# Patient Record
Sex: Male | Born: 1995 | Race: White | Hispanic: No | Marital: Single | Smoking: Current every day smoker
Health system: Southern US, Community
[De-identification: ages and names within clinical notes are randomized; demographics above are authoritative.]

## PROBLEM LIST (undated history)

## (undated) DIAGNOSIS — F419 Anxiety disorder, unspecified: Secondary | ICD-10-CM

---
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
## 2015-05-09 ENCOUNTER — Ambulatory Visit: Admission: EM | Admit: 2015-05-09 | Discharge: 2015-05-09 | Discharge status: Absent without leave | Payer: Self-pay

## 2016-05-25 ENCOUNTER — Emergency Department
Admission: EM | Admit: 2016-05-25 | Discharge: 2016-05-26 | Disposition: A | Discharge status: Home / Self Care | Payer: Self-pay | Attending: Emergency Medicine | Admitting: Emergency Medicine

## 2016-05-25 DIAGNOSIS — F1123 Opioid dependence with withdrawal: Secondary | ICD-10-CM

## 2016-05-25 DIAGNOSIS — F17200 Nicotine dependence, unspecified, uncomplicated: Secondary | ICD-10-CM | POA: Insufficient documentation

## 2016-05-25 DIAGNOSIS — T40601A Poisoning by unspecified narcotics, accidental (unintentional), initial encounter: Secondary | ICD-10-CM

## 2016-05-25 DIAGNOSIS — F1193 Opioid use, unspecified with withdrawal: Secondary | ICD-10-CM

## 2016-05-25 DIAGNOSIS — T402X1A Poisoning by other opioids, accidental (unintentional), initial encounter: Secondary | ICD-10-CM | POA: Insufficient documentation

## 2016-05-25 HISTORY — DX: Anxiety disorder, unspecified: F41.9

## 2016-05-25 NOTE — ED Provider Notes (Signed)
[facility] Emergency Department Provider Note  ____________________________________________   None    (approximate)  I have reviewed the triage vital signs and the nursing notes.   HISTORY  Chief Complaint Drug Overdose    HPI Daniel Costa is a 21 y.o. male who comes to the emergency department via EMS for presumptive drug overdose. According to report the patient was unconscious with pinpoint pupils and slow respirations. He also had some seizure-like activity so she was given a total of 1.5 mg of intranasal naloxone. He then began to rales and was held down, an IV was started, and he was given 2 mg of intravenous naloxone. Shortly thereafter he really woke up completely and began to vomit. When I asked the patient what happened he is currently taking Suboxone to try to wean himself off of Percocet. He said today he was taking Suboxone and also drinking a little bit of alcohol.   Past Medical History:  Diagnosis Date  . Anxiety     There are no active problems to display for this patient.   History reviewed. No pertinent surgical history.  Prior to Admission medications   Medication Sig Start Date End Date Taking? Authorizing Provider  naloxone [facility](NARCAN) nasal spray 4 mg/0.1 mL Please give one spray in your nostril as needed for opiate overdose 05/26/16   Merrily BrittleNeil Chancey Ringel, MD  ondansetron (ZOFRAN) 4 MG tablet Take 1 tablet (4 mg total) by mouth daily as needed for nausea or vomiting. 05/26/16 05/26/17  Merrily BrittleNeil Jaki Steptoe, MD    Allergies Patient has no known allergies.  No family history on file.  Social History Social History  Substance Use Topics  . Smoking status: Current Every Day Smoker    Packs/day: 0.50  . Smokeless tobacco: Never Used  . Alcohol use Yes    Review of Systems Constitutional: No fever/chills Eyes: No visual changes. ENT: No sore throat. Cardiovascular: Denies chest pain. Respiratory: Denies shortness of  breath. Gastrointestinal: Positive abdominal pain.  Positive nausea, positive vomiting.  No diarrhea.  No constipation. Genitourinary: Negative for dysuria. Musculoskeletal: Negative for back pain. Skin: Negative for rash. Neurological: Negative for headaches, focal weakness or numbness.  10-point ROS otherwise negative.  ____________________________________________   PHYSICAL EXAM:  VITAL SIGNS: ED Triage Vitals  Enc Vitals Group     BP      Pulse      Resp      Temp      Temp src      SpO2      Weight      Height      Head Circumference      Peak Flow      Pain Score      Pain Loc      Pain Edu?      Excl. in GC?     Constitutional: Alert and oriented x 4 Retching and vomiting curled on his side appears extremely uncomfortable Eyes: PERRL EOMI. dilated pupils Head: Atraumatic. Nose: No congestion/rhinnorhea. Mouth/Throat: No trismus Neck: No stridor.   Cardiovascular: Normal rate, regular rhythm. Grossly normal heart sounds.  Good peripheral circulation. Respiratory: Normal respiratory effort.  No retractions. Lungs CTAB and moving good air Gastrointestinal: Soft nondistended nontender no rebound no guarding no peritonitis no McBurney's tenderness negative Rovsing's no costovertebral tenderness negative Murphy's Musculoskeletal: No lower extremity edema   Neurologic:  Normal speech and language. No gross focal neurologic deficits are appreciated. Skin:  Skin is warm, dry and intact. No rash noted.  Psychiatric: Mood and affect are normal. Speech and behavior are normal.    ____________________________________________   DIFFERENTIAL  Opiate overdose, opiate withdrawal, alcohol intoxication, metabolic drainage ____________________________________________   LABS (all labs ordered are listed, but only abnormal results are displayed)  Labs Reviewed - No data to display   __________________________________________  EKG  ED ECG REPORT I, Merrily Brittle,  the attending physician, personally viewed and interpreted this ECG.  Date: 05/26/2016 Rate: 97 Rhythm: normal sinus rhythm QRS Axis: normal Intervals: normal ST/T Wave abnormalities: normal Conduction Disturbances: none Narrative Interpretation: unremarkable  ____________________________________________  RADIOLOGY  Dg Chest Port 1 View  Result Date: 05/26/2016 CLINICAL DATA:  Drug overdose EXAM: PORTABLE CHEST 1 VIEW COMPARISON:  None. FINDINGS: The heart size and mediastinal contours are within normal limits. Both lungs are clear. The visualized skeletal structures are unremarkable. Incidentally noted azygos fissure of the right upper lobe, a normal variant. IMPRESSION: Clear lungs. Electronically Signed   By: Deatra Robinson M.D.   On: 05/26/2016 00:19    ____________________________________________   PROCEDURES  Procedure(s) performed: no  Procedures  Critical Care performed: no  ____________________________________________   INITIAL IMPRESSION / ASSESSMENT AND PLAN / ED COURSE  Pertinent labs & imaging results that were available during my care of the patient were reviewed by me and considered in my medical decision making (see chart for details).  Unfortunately the patient has gone from opioid overdose to florid opioid withdrawal secondary to large dose of naloxone. I will treat him symptomatically and reevaluate.  After clonidine Toradol and fluids the patient feels improved. This was an unintentional overdose. He is discharged home in the custody of his girlfriend with a prescription for naloxone.      ____________________________________________   FINAL CLINICAL IMPRESSION(S) / ED DIAGNOSES  Final diagnoses:  Opiate overdose, accidental or unintentional, initial encounter  Opiate withdrawal (HCC)      NEW MEDICATIONS STARTED DURING THIS VISIT:  Discharge Medication List as of 05/26/2016  1:30 AM    START taking these medications   Details   naloxone (NARCAN) nasal spray 4 mg/0.1 mL Please give one spray in your nostril as needed for opiate overdose, Print    ondansetron (ZOFRAN) 4 MG tablet Take 1 tablet (4 mg total) by mouth daily as needed for nausea or vomiting., Starting Sat 05/26/2016, Until Sun 05/26/2017, Print         Note:  This document was prepared using Dragon voice recognition software and may include unintentional dictation errors.     Merrily Brittle, MD 05/26/16 [phone]

## 2016-05-26 ENCOUNTER — Emergency Department: Payer: Self-pay

## 2016-05-26 ENCOUNTER — Encounter: Payer: Self-pay | Admitting: Emergency Medicine

## 2016-05-26 MED ORDER — ONDANSETRON HCL 4 MG/2ML IJ SOLN: 4.0000 mg | Freq: Once | INTRAMUSCULAR | Status: DC

## 2016-05-26 MED ORDER — ONDANSETRON HCL 4 MG PO TABS: 4.0000 mg | ORAL_TABLET | Freq: Every day | ORAL | 1 refills | Status: AC | PRN

## 2016-05-26 MED ORDER — KETOROLAC TROMETHAMINE 30 MG/ML IJ SOLN
15.0000 mg | Freq: Once | INTRAMUSCULAR | Status: AC
  Administered 2016-05-26: 15 mg via INTRAVENOUS
  Filled 2016-05-26: qty 1

## 2016-05-26 MED ORDER — ONDANSETRON HCL 4 MG/2ML IJ SOLN
4.0000 mg | Freq: Once | INTRAMUSCULAR | Status: AC
  Administered 2016-05-26: 4 mg via INTRAVENOUS
  Filled 2016-05-26: qty 2

## 2016-05-26 MED ORDER — SODIUM CHLORIDE 0.9 % IV BOLUS (SEPSIS)
1000.0000 mL | Freq: Once | INTRAVENOUS | Status: AC
  Administered 2016-05-26: 1000 mL via INTRAVENOUS

## 2016-05-26 MED ORDER — NALOXONE HCL 4 MG/0.1ML NA LIQD: NASAL | 1 refills | Status: AC

## 2016-05-26 MED ORDER — CLONIDINE HCL 0.1 MG PO TABS
0.1000 mg | ORAL_TABLET | Freq: Once | ORAL | Status: AC
  Administered 2016-05-26: 0.1 mg via ORAL
  Filled 2016-05-26: qty 1

## 2016-05-26 NOTE — Discharge Instructions (Signed)
Please fill your prescription for Narcan and make sure that you always have one of the kids nearby if you ever use. Return to the emergency department for any new or worsening symptoms.  It was a pleasure to take care of you today, and thank you for coming to our emergency department.  If you have any questions or concerns before leaving please ask the nurse to grab me and I'm more than happy to go through your aftercare instructions again.  If you were prescribed any opioid pain medication today such as Norco, Vicodin, Percocet, morphine, hydrocodone, or oxycodone please make sure you do not drive when you are taking this medication as it can alter your ability to drive safely.  If you have any concerns once you are home that you are not improving or are in fact getting worse before you can make it to your follow-up appointment, please do not hesitate to call 911 and come back for further evaluation.  Merrily BrittleNeil Jehad Bisono MD  No results found for this or any previous visit. Dg Chest Port 1 View  Result Date: 05/26/2016 CLINICAL DATA:  Drug overdose EXAM: PORTABLE CHEST 1 VIEW COMPARISON:  None. FINDINGS: The heart size and mediastinal contours are within normal limits. Both lungs are clear. The visualized skeletal structures are unremarkable. Incidentally noted azygos fissure of the right upper lobe, a normal variant. IMPRESSION: Clear lungs. Electronically Signed   By: Deatra RobinsonKevin  Herman M.D.   On: 05/26/2016 00:19

## 2016-05-26 NOTE — ED Triage Notes (Signed)
Pt arrives to ED via [facility] with c/o OD. Pt states that he took part of a "subitec" and drank two beers with this. Pt was found by EMS unresponsive and was give 1.5 mg narcan intranasally and then 2 mg Narcan via IV. Pt was initially combative upon waking but is calm and cooperative at this time in triage. Pt denies any intent for self harm.

## 2016-05-27 NOTE — ED Provider Notes (Signed)
  [facility]-EMERGENCY DEPARTMENT Provider Note  HPI  Past Medical History:  Diagnosis Date  . Anxiety     There are no active problems to display for this patient.   History reviewed. No pertinent surgical history.  THIS NOTE WAS CREATED IN ERROR    Home Medications    Prior to Admission medications   Medication Sig Start Date End Date Taking? Authorizing Provider  naloxone [facility](NARCAN) nasal spray 4 mg/0.1 mL Please give one spray in your nostril as needed for opiate overdose 05/26/16   Merrily BrittleNeil Shirley Bolle, MD  ondansetron (ZOFRAN) 4 MG tablet Take 1 tablet (4 mg total) by mouth daily as needed for nausea or vomiting. 05/26/16 05/26/17  Merrily BrittleNeil Zo Loudon, MD    Family History No family history on file.  Social History Social History  Substance Use Topics  . Smoking status: Current Every Day Smoker    Packs/day: 0.50  . Smokeless tobacco: Never Used  . Alcohol use Yes     Allergies   Patient has no known allergies.   Review of Systems Review of Systems   Physical Exam Updated Vital Signs BP (!) 95/54   Pulse 68   Temp 98.6 F (37 C) (Oral)   Resp 19   Ht 5\' 8"  (1.727 m)   Wt 150 lb (68 kg)   SpO2 95%   BMI 22.81 kg/m   Physical Exam   ED Treatments / Results  Labs (all labs ordered are listed, but only abnormal results are displayed) Labs Reviewed - No data to display  EKG  EKG Interpretation None       Radiology Dg Chest Port 1 View  Result Date: 05/26/2016 CLINICAL DATA:  Drug overdose EXAM: PORTABLE CHEST 1 VIEW COMPARISON:  None. FINDINGS: The heart size and mediastinal contours are within normal limits. Both lungs are clear. The visualized skeletal structures are unremarkable. Incidentally noted azygos fissure of the right upper lobe, a normal variant. IMPRESSION: Clear lungs. Electronically Signed   By: Deatra RobinsonKevin  Herman M.D.   On: 05/26/2016 00:19    Procedures Procedures (including critical care time)  Medications Ordered in ED Medications    sodium chloride 0.9 % bolus 1,000 mL (0 mLs Intravenous Stopped 05/26/16 0155)  cloNIDine (CATAPRES) tablet 0.1 mg (0.1 mg Oral Given 05/26/16 0026)  ketorolac (TORADOL) 30 MG/ML injection 15 mg (15 mg Intravenous Given 05/26/16 0026)  ondansetron (ZOFRAN) injection 4 mg (4 mg Intravenous Given 05/26/16 0026)     Initial Impression / Assessment and Plan / ED Course  I have reviewed the triage vital signs and the nursing notes.  Pertinent labs & imaging results that were available during my care of the patient were reviewed by me and considered in my medical decision making (see chart for details).       Final Clinical Impressions(s) / ED Diagnoses   Final diagnoses:  Opiate overdose, accidental or unintentional, initial encounter  Opiate withdrawal [facility](HCC)    New Prescriptions Discharge Medication List as of 05/26/2016  1:30 AM    START taking these medications   Details  naloxone (NARCAN) nasal spray 4 mg/0.1 mL Please give one spray in your nostril as needed for opiate overdose, Print    ondansetron (ZOFRAN) 4 MG tablet Take 1 tablet (4 mg total) by mouth daily as needed for nausea or vomiting., Starting Sat 05/26/2016, Until Sun 05/26/2017, Print         Merrily BrittleNeil Dianca Owensby, MD 05/27/16 [phone]

## 2018-05-01 IMAGING — DX DG CHEST 1V PORT
1 series · 1 of 1 positions shown · non-contrast
Comparison: None.

CLINICAL DATA: Drug overdose

EXAM:
PORTABLE CHEST 1 VIEW

[chest ap]
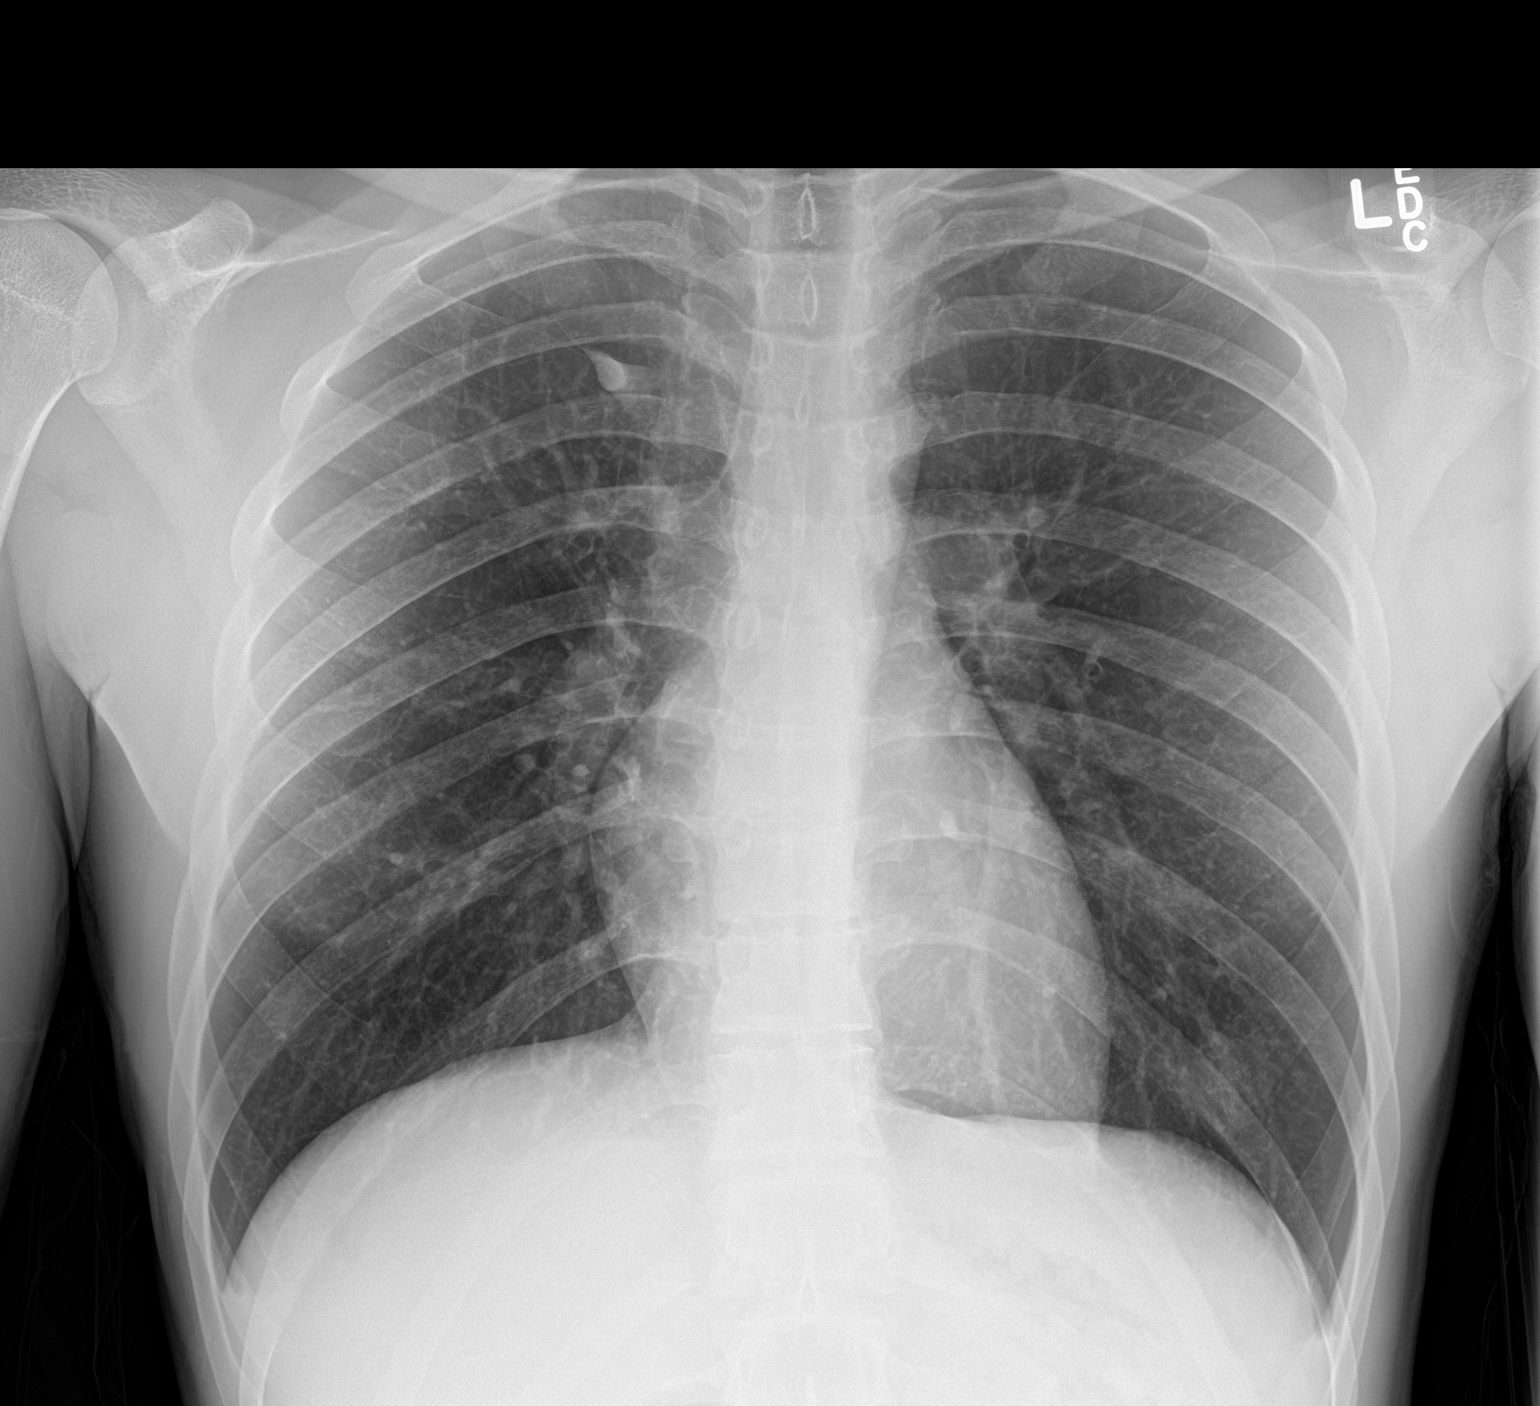

[1 of 1 positions shown; findings below may reference images not displayed]

FINDINGS: The heart size and mediastinal contours are within normal limits.
Both lungs are clear. The visualized skeletal structures are
unremarkable. Incidentally noted azygos fissure of the right upper
lobe, a normal variant.
IMPRESSION: Clear lungs.
